# Patient Record
Sex: Female | Born: 1985 | Race: White | Hispanic: No | Marital: Single
Health system: Midwestern US, Community
[De-identification: ages and names within clinical notes are randomized; demographics above are authoritative.]

## PROBLEM LIST (undated history)

## (undated) DIAGNOSIS — R55 Syncope and collapse: Principal | ICD-10-CM

## (undated) DIAGNOSIS — B1920 Unspecified viral hepatitis C without hepatic coma: Secondary | ICD-10-CM

## (undated) HISTORY — PX: OTHER SURGICAL HISTORY: SHX169

---

## 2020-10-12 ENCOUNTER — Encounter (HOSPITAL_COMMUNITY): Payer: Self-pay | Admitting: *Deleted

## 2020-10-12 ENCOUNTER — Other Ambulatory Visit: Payer: Self-pay

## 2020-10-12 ENCOUNTER — Emergency Department (HOSPITAL_COMMUNITY): Payer: Self-pay

## 2020-10-12 ENCOUNTER — Emergency Department (HOSPITAL_COMMUNITY)
Admission: EM | Admit: 2020-10-12 | Discharge: 2020-10-12 | Disposition: A | Discharge status: Home / Self Care | Payer: Self-pay | Attending: Emergency Medicine | Admitting: Emergency Medicine

## 2020-10-12 DIAGNOSIS — S1091XA Abrasion of unspecified part of neck, initial encounter: Secondary | ICD-10-CM | POA: Insufficient documentation

## 2020-10-12 DIAGNOSIS — S0990XA Unspecified injury of head, initial encounter: Secondary | ICD-10-CM | POA: Insufficient documentation

## 2020-10-12 DIAGNOSIS — T148XXA Other injury of unspecified body region, initial encounter: Secondary | ICD-10-CM

## 2020-10-12 DIAGNOSIS — Z91040 Latex allergy status: Secondary | ICD-10-CM | POA: Insufficient documentation

## 2020-10-12 DIAGNOSIS — I959 Hypotension, unspecified: Secondary | ICD-10-CM | POA: Insufficient documentation

## 2020-10-12 HISTORY — DX: Unspecified viral hepatitis C without hepatic coma: B19.20

## 2020-10-12 MED ORDER — LORAZEPAM 2 MG/ML IJ SOLN
2.0000 mg | Freq: Once | INTRAMUSCULAR | Status: AC
  Administered 2020-10-12: 2 mg via INTRAMUSCULAR
  Filled 2020-10-12: qty 1

## 2020-10-12 MED ORDER — LACTATED RINGERS IV BOLUS
2000.0000 mL | Freq: Once | INTRAVENOUS | Status: AC
  Administered 2020-10-12: 2000 mL via INTRAVENOUS

## 2020-10-12 NOTE — ED Notes (Signed)
Much calmer.  
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
## 2022-04-15 ENCOUNTER — Inpatient Hospital Stay
Admit: 2022-04-15 | Discharge: 2022-04-15 | Disposition: A | Discharge status: Home / Self Care | Attending: Emergency Medicine

## 2022-04-15 DIAGNOSIS — F1120 Opioid dependence, uncomplicated: Secondary | ICD-10-CM

## 2022-04-15 MED ORDER — DICYCLOMINE HCL 10 MG PO CAPS
10 MG | Freq: Once | ORAL | Status: AC
Start: 2022-04-15 — End: 2022-04-15
  Administered 2022-04-15: 21:00:00 20 mg via ORAL

## 2022-04-15 MED ORDER — ONDANSETRON HCL 4 MG PO TABS
4 MG | Freq: Once | ORAL | Status: AC
Start: 2022-04-15 — End: 2022-04-15
  Administered 2022-04-15: 21:00:00 4 mg via ORAL

## 2022-04-15 MED FILL — ONDANSETRON HCL 4 MG PO TABS: 4 MG | ORAL | Qty: 1

## 2022-04-15 MED FILL — DICYCLOMINE HCL 10 MG PO CAPS: 10 MG | ORAL | Qty: 2

## 2022-04-15 NOTE — ED Provider Notes (Signed)
[facility] EMERGENCY DEPT  EMERGENCY DEPARTMENT ENCOUNTER      Pt Name: Shelley Nguyen  MRN: [id number]  Birthdate 07/17/1986  Date of evaluation: 04/15/2022  Provider: Charolett Bumpers, MD    CHIEF COMPLAINT       Chief Complaint   Patient presents with    Medication Refill     Pt moved 10 days ago from [facility] unexpectantly. Pt states that she has run out of suboxone. Pt reports she is 111 days clean from heroin and completed treatment may 25. Pt was in [facility]. Pt state's "if yall aren't going to help me, I'm going to relapse" pt states that she was taking two 8mg  buccal strips daily         HISTORY OF PRESENT ILLNESS   (Location/Symptom, Timing/Onset, Context/Setting, Quality, Duration, Modifying Factors, Severity)  Note limiting factors.   Narelle Schoening is a 36 y.o. female who presents to the emergency department concerns of wanting Suboxone refilled.  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  She has been clean for the last 111 days.  Moved here over the last 10 days and is now out of her Suboxone.  She was in [facility] treatment center.  Denies any SI or HI.  No chest pain, shortness of breath or other symptom.    HPI    Nursing Notes were reviewed.    REVIEW OF SYSTEMS    (2-9 systems for level 4, 10 or more for level 5)     Review of Systems   All other systems reviewed and are negative.    Except as noted above the remainder of the review of systems was reviewed and negative.       PAST MEDICAL HISTORY   History reviewed. No pertinent past medical history.      SURGICAL HISTORY     History reviewed. No pertinent surgical history.      CURRENT MEDICATIONS       Previous Medications    No medications on file       ALLERGIES     Latex and Sulfa antibiotics    FAMILY HISTORY     No family history on file.       SOCIAL HISTORY       Social History     Socioeconomic History    Marital status: Single     Spouse name: None    Number of children: None    Years of education: None    Highest education  level: None       SCREENINGS         Glasgow Coma Scale  Eye Opening: Spontaneous  Best Verbal Response: Oriented  Best Motor Response: Obeys commands  Glasgow Coma Scale Score: 15                     CIWA Assessment  BP: 120/78  Pulse: 64                 PHYSICAL EXAM    (up to 7 for level 4, 8 or more for level 5)     ED Triage Vitals [04/15/22 1546]   BP Temp Temp src Pulse Respirations SpO2 Height Weight - Scale   120/78 -- -- 64 18 99 % 5\' 1"  (1.549 m) 150 lb (68 kg)       Physical Exam  Vitals and nursing note reviewed.   Constitutional:       General: She is not in  acute distress.     Appearance: Normal appearance.   HENT:      Head: Normocephalic and atraumatic.      Right Ear: External ear normal.      Left Ear: External ear normal.      Nose: Nose normal.      Mouth/Throat:      Mouth: Mucous membranes are moist.   Eyes:      Extraocular Movements: Extraocular movements intact.      Pupils: Pupils are equal, round, and reactive to light.   Cardiovascular:      Rate and Rhythm: Normal rate and regular rhythm.      Pulses: Normal pulses.      Heart sounds: Normal heart sounds.   Musculoskeletal:         General: Normal range of motion.      Cervical back: Normal range of motion and neck supple. No tenderness.   Skin:     General: Skin is warm and dry.      Capillary Refill: Capillary refill takes less than 2 seconds.   Neurological:      General: No focal deficit present.      Mental Status: She is alert and oriented to person, place, and time. Mental status is at baseline.   Psychiatric:         Mood and Affect: Mood normal.         Behavior: Behavior normal.       DIAGNOSTIC RESULTS     EKG: All EKG's are interpreted by the Emergency Department Physician who either signs or Co-signs this chart in the absence of a cardiologist.        RADIOLOGY:   Non-plain film images such as CT, Ultrasound and MRI are read by the radiologist. Plain radiographic images are visualized and preliminarily interpreted by the  emergency physician with the below findings:        Interpretation per the Radiologist below, if available at the time of this note:    No orders to display         ED BEDSIDE ULTRASOUND:   Performed by ED Physician - none    LABS:  Labs Reviewed - No data to display    All other labs were within normal range or not returned as of this dictation.    EMERGENCY DEPARTMENT COURSE and DIFFERENTIAL DIAGNOSIS/MDM:   Vitals:    Vitals:    04/15/22 1546   BP: 120/78   Pulse: 64   Resp: 18   SpO2: 99%   Weight: 68 kg   Height: 5\' 1"  (1.549 m)           Medical Decision Making  36 year old female presents for concerns of wanting a Suboxone refill.  I have had a lengthy chat with the patient that she would need to follow-up with the [facility].  We will provide a dose of Bentyl and Zofran here.  She does not appear to be actively withdrawing at this moment.    Risk  Prescription drug management.            REASSESSMENT          CRITICAL CARE TIME     FINAL IMPRESSION      1. Uncomplicated opioid dependence [facility])          DISPOSITION/PLAN   DISPOSITION Decision To Discharge 04/15/2022 04:59:03 PM      PATIENT REFERRED TO:  [facility]  [address]  [place]  [place] [address]  [phone]          DISCHARGE MEDICATIONS:  New Prescriptions    No medications on file     Controlled Substances Monitoring:     No flowsheet data found.    (Please note that portions of this note were completed with a voice recognition program.  Efforts were made to edit the dictations but occasionally words are mis-transcribed.)    Charolett Bumpers, MD (electronically signed)  Attending Emergency Physician            Charolett Bumpers, MD  04/15/22 [phone]
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
## 2022-11-24 NOTE — Telephone Encounter (Signed)
Working media referral to [facility] for hand and wrist- he does not treat    Called and vm box was not set up yet

## 2022-11-27 NOTE — Telephone Encounter (Addendum)
Call # 2 vm box was full
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
## 2022-12-19 ENCOUNTER — Encounter: Attending: Orthopaedic Surgery

## 2022-12-19 NOTE — Telephone Encounter (Signed)
error 

## 2023-01-01 ENCOUNTER — Encounter: Admit: 2023-01-01 | Discharge: 2023-01-01 | Attending: Physician Assistant

## 2023-04-02 ENCOUNTER — Emergency Department: Admit: 2023-04-02 | Payer: PRIVATE HEALTH INSURANCE

## 2023-04-02 ENCOUNTER — Inpatient Hospital Stay
Admit: 2023-04-02 | Discharge: 2023-04-02 | Disposition: A | Discharge status: Home / Self Care | Payer: PRIVATE HEALTH INSURANCE | Attending: Emergency Medicine

## 2023-04-02 DIAGNOSIS — M5442 Lumbago with sciatica, left side: Secondary | ICD-10-CM

## 2023-04-02 MED ORDER — METHOCARBAMOL 750 MG PO TABS
750 | ORAL_TABLET | Freq: Four times a day (QID) | ORAL | 0 refills | Status: AC
Start: 2023-04-02 — End: 2023-04-07

## 2023-04-02 MED ORDER — NAPROXEN 500 MG PO TABS
500 | ORAL_TABLET | Freq: Two times a day (BID) | ORAL | 0 refills | Status: AC
Start: 2023-04-02 — End: 2023-04-07

## 2023-04-02 MED ORDER — MUPIROCIN 2 % EX OINT
2 | CUTANEOUS | 0 refills | Status: AC
Start: 2023-04-02 — End: 2023-04-09

## 2023-04-02 MED ORDER — LIDOCAINE HCL 4 % EX CREA
4 | Freq: Four times a day (QID) | CUTANEOUS | 0 refills | Status: AC | PRN
Start: 2023-04-02 — End: ?

## 2023-04-02 MED ORDER — KETOROLAC TROMETHAMINE 15 MG/ML IJ SOLN
15 | Freq: Once | INTRAMUSCULAR | Status: AC
Start: 2023-04-02 — End: 2023-04-02
  Administered 2023-04-02: 23:00:00 15 mg via INTRAMUSCULAR

## 2023-04-02 MED ORDER — LIDOCAINE 4 % EX PTCH
4 | Freq: Once | CUTANEOUS | Status: DC
Start: 2023-04-02 — End: 2023-04-02
  Administered 2023-04-02: 23:00:00 1 via TRANSDERMAL

## 2023-04-02 MED FILL — SALONPAS PAIN RELIEVING 4 % EX PTCH: 4 % | CUTANEOUS | Qty: 1

## 2023-04-02 MED FILL — KETOROLAC TROMETHAMINE 15 MG/ML IJ SOLN: 15 MG/ML | INTRAMUSCULAR | Qty: 1
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
## 2023-04-02 NOTE — ED Notes (Signed)
Report given and care transferred to Jennifer, RN

## 2023-07-18 ENCOUNTER — Inpatient Hospital Stay
Admit: 2023-07-18 | Discharge: 2023-07-18 | Disposition: A | Discharge status: Home / Self Care | Payer: MEDICAID | Attending: Emergency Medicine

## 2023-07-18 DIAGNOSIS — R0602 Shortness of breath: Secondary | ICD-10-CM

## 2023-07-18 DIAGNOSIS — R0600 Dyspnea, unspecified: Secondary | ICD-10-CM

## 2023-07-18 LAB — COVID-19 & INFLUENZA COMBO (LIAT HOSPITAL)
Influenza A: NOT DETECTED
Influenza B: NOT DETECTED
SARS-CoV-2: NOT DETECTED

## 2023-07-18 MED ORDER — LORAZEPAM 1 MG PO TABS
1 | Freq: Once | ORAL | Status: AC
Start: 2023-07-18 — End: 2023-07-18
  Administered 2023-07-18: 19:00:00 1 mg via ORAL

## 2023-07-18 MED ORDER — LOPERAMIDE HCL 2 MG PO CAPS
2 | Freq: Once | ORAL | Status: AC
Start: 2023-07-18 — End: 2023-07-18
  Administered 2023-07-18: 18:00:00 2 mg via ORAL

## 2023-07-18 MED FILL — LOPERAMIDE HCL 2 MG PO CAPS: 2 MG | ORAL | Qty: 1 | Fill #0

## 2023-07-18 MED FILL — LORAZEPAM 1 MG PO TABS: 1 MG | ORAL | Qty: 1 | Fill #0

## 2023-07-18 NOTE — ED Provider Notes (Signed)
 [facility]  EMERGENCY DEPARTMENT ENCOUNTER      Pt Name: Shelley Nguyen  MRN: [id number]  Birthdate 06/25/1986  Date of evaluation: 07/18/2023  Provider: Charolett Bumpers, MD  1:48 PM    CHIEF COMPLAINT       Chief Complaint   Patient presents

## 2023-07-20 LAB — EKG 12-LEAD
P Axis: 69 degrees
P-R Interval: 114 ms
Q-T Interval: 346 ms
QRS Duration: 70 ms
QTc Calculation (Bazett): 393 ms
R Axis: 64 degrees
T Axis: 50 degrees
Ventricular Rate: 90 {beats}/min

## 2023-07-22 ENCOUNTER — Inpatient Hospital Stay
Admit: 2023-07-22 | Discharge: 2023-07-23 | Disposition: A | Discharge status: Home / Self Care | Payer: MEDICAID | Admitting: Emergency Medicine

## 2023-07-22 ENCOUNTER — Emergency Department: Admit: 2023-07-23 | Payer: MEDICAID

## 2023-07-22 DIAGNOSIS — J40 Bronchitis, not specified as acute or chronic: Secondary | ICD-10-CM

## 2023-07-22 MED ORDER — LORAZEPAM 1 MG PO TABS
1 | Freq: Once | ORAL | Status: AC
Start: 2023-07-22 — End: 2023-07-22

## 2023-07-22 MED ORDER — LORAZEPAM 1 MG PO TABS
1 | ORAL | Status: AC
Start: 2023-07-22 — End: 2023-07-22
  Administered 2023-07-23: 1 via ORAL

## 2023-07-22 MED ORDER — LORAZEPAM 2 MG/ML IJ SOLN
2 | Freq: Once | INTRAMUSCULAR | Status: DC
Start: 2023-07-22 — End: 2023-07-22

## 2023-07-22 MED FILL — LORAZEPAM 1 MG PO TABS: 1 MG | ORAL | Qty: 1

## 2023-07-22 NOTE — Discharge Instructions (Addendum)
 Please return to the emergency room with any acute questions, concerns, worsening status or any delay to your outpatient care.    Thank you so much for visiting with [place] today.  You have had a screening emergency medical exam and to this point, no emergent

## 2023-07-22 NOTE — ED Provider Notes (Signed)
 [facility]  EMERGENCY DEPARTMENT ENCOUNTER      Pt Name: Shelley Nguyen  MRN: [id number]  Birthdate May 08, 1986  Date of evaluation: 07/22/2023  Provider: Brendolyn Patty, MD  Provider evaluation time: 07/22/23 1809    CHIEF COMPLAINT       Chi

## 2023-07-23 LAB — CBC WITH AUTO DIFFERENTIAL
Basophils %: 0.3 % (ref 0.0–2.0)
Basophils Absolute: 0 10*3/uL (ref 0.0–0.2)
Eosinophils %: 3.7 % (ref 0.0–7.0)
Eosinophils Absolute: 0.4 10*3/uL (ref 0.0–0.5)
Hematocrit: 39.2 % (ref 34.0–47.0)
Hemoglobin: 12.7 g/dL (ref 11.5–15.7)
Immature Grans (Abs): 0.01 10*3/uL (ref 0.00–0.06)
Immature Granulocytes %: 0.1 % (ref 0.0–0.6)
Lymphocytes Absolute: 3.5 10*3/uL — ABNORMAL HIGH (ref 1.0–3.2)
Lymphocytes: 30.6 % (ref 15.0–45.0)
MCH: 28 pg (ref 27.0–34.5)
MCHC: 32.4 g/dL (ref 30.0–36.0)
MCV: 86.3 fL (ref 81.0–99.0)
MPV: 10.3 fL (ref 7.0–12.2)
Monocytes %: 7.8 % (ref 4.0–12.0)
Monocytes Absolute: 0.9 10*3/uL (ref 0.3–1.0)
Neutrophils %: 57.5 % (ref 42.0–74.0)
Neutrophils Absolute: 6.6 10*3/uL (ref 1.6–7.3)
Platelets: 364 10*3/uL (ref 140–440)
RBC: 4.54 x10e6/mcL (ref 3.60–5.20)
RDW: 13 % (ref 10.0–17.0)
WBC: 11.6 10*3/uL — ABNORMAL HIGH (ref 3.8–10.6)

## 2023-07-23 LAB — BASIC METABOLIC PANEL W/ REFLEX TO MG FOR LOW K
Anion Gap: 11 mmol/L (ref 2–17)
BUN: 5 mg/dL — ABNORMAL LOW (ref 6–20)
CO2: 26 mmol/L (ref 22–29)
Calcium: 9 mg/dL (ref 8.5–10.7)
Chloride: 101 mmol/L (ref 98–107)
Creatinine: 0.5 mg/dL (ref 0.5–1.0)
Est, Glom Filt Rate: 125 mL/min/1.73mÂ² (ref >=60)
Glucose: 101 mg/dL — ABNORMAL HIGH (ref 70–99)
Osmolaliy Calculated: 273 mosm/kg (ref 270–287)
Potassium: 3.2 mmol/L — ABNORMAL LOW (ref 3.5–5.3)
Sodium: 138 mmol/L (ref 135–145)

## 2023-07-23 LAB — URINE DRUG SCREEN
Amphetamine Screen, Ur: NEGATIVE
Barbiturate Screen, Ur: NEGATIVE
Benzodiazepine Screen, Urine: POSITIVE — AB
Cannabinoid Scrn, Ur: NEGATIVE
Cocaine Screen Urine: POSITIVE — AB
Fentanyl, Ur: NEGATIVE
Opiate Screen, Urine: NEGATIVE

## 2023-07-23 LAB — D-DIMER, QUANTITATIVE: D-Dimer, Quant: <=0.27 CD:295178345 (ref 0.19–0.50)

## 2023-07-23 LAB — MAGNESIUM: Magnesium: 2 mg/dL (ref 1.6–2.6)

## 2023-07-23 LAB — TROPONIN: Troponin, High Sensitivity: <6 ng/L (ref 0–14)

## 2023-07-23 MED ORDER — PREDNISONE 20 MG PO TABS
20 | ORAL_TABLET | Freq: Every day | ORAL | 0 refills | Status: AC
Start: 2023-07-23 — End: 2023-07-28

## 2023-07-23 MED ORDER — AZITHROMYCIN 250 MG PO TABS
250 | PACK | ORAL | 0 refills | Status: DC
Start: 2023-07-23 — End: 2023-07-22

## 2023-07-23 MED ORDER — ALBUTEROL SULFATE HFA 108 (90 BASE) MCG/ACT IN AERS
108 | Freq: Four times a day (QID) | RESPIRATORY_TRACT | 0 refills | Status: DC | PRN
Start: 2023-07-23 — End: 2023-07-22

## 2023-07-23 MED ORDER — AZITHROMYCIN 250 MG PO TABS
250 | PACK | ORAL | 0 refills | Status: AC
Start: 2023-07-23 — End: ?

## 2023-07-23 MED ORDER — PREDNISONE 20 MG PO TABS
20 | ORAL_TABLET | Freq: Every day | ORAL | 0 refills | Status: DC
Start: 2023-07-23 — End: 2023-07-22

## 2023-07-23 MED ORDER — ALBUTEROL SULFATE HFA 108 (90 BASE) MCG/ACT IN AERS
108 | Freq: Four times a day (QID) | RESPIRATORY_TRACT | 0 refills | Status: AC | PRN
Start: 2023-07-23 — End: ?

## 2023-07-24 LAB — EKG 12-LEAD
P Axis: 65 degrees
P-R Interval: 122 ms
Q-T Interval: 342 ms
QRS Duration: 74 ms
QTc Calculation (Bazett): 401 ms
R Axis: 62 degrees
T Axis: 31 degrees
Ventricular Rate: 103 {beats}/min

## 2023-12-09 NOTE — Progress Notes (Signed)
 This encounter was created in error - please disregard.

## 2024-02-20 DIAGNOSIS — R1084 Generalized abdominal pain: Principal | ICD-10-CM
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
## 2024-02-21 ENCOUNTER — Inpatient Hospital Stay
Admit: 2024-02-21 | Discharge: 2024-02-21 | Discharge status: Against Medical Advice | Payer: PRIVATE HEALTH INSURANCE | Attending: Emergency Medicine

## 2024-09-04 ENCOUNTER — Emergency Department: Admit: 2024-09-04

## 2024-09-04 ENCOUNTER — Inpatient Hospital Stay
Admit: 2024-09-04 | Discharge: 2024-09-04 | Disposition: A | Discharge status: Home / Self Care | Arrived: AM | Attending: Emergency Medicine

## 2024-09-04 DIAGNOSIS — R55 Syncope and collapse: Principal | ICD-10-CM

## 2024-09-04 LAB — COMPREHENSIVE METABOLIC PANEL
ALT: 35 U/L (ref 0–42)
AST: 41 U/L (ref 0–46)
Albumin/Globulin Ratio: 1.65 (ref 1.00–2.70)
Albumin: 3.8 g/dL (ref 3.5–5.2)
Alk Phosphatase: 69 U/L (ref 35–117)
Anion Gap: 10 mmol/L (ref 2–17)
BUN: 9 mg/dL (ref 6–20)
CALCIUM,CORRECTED,CCA: 8.8 mg/dL (ref 8.5–10.7)
CO2: 22 mmol/L (ref 22–29)
Calcium: 8.6 mg/dL (ref 8.5–10.7)
Chloride: 104 mmol/L (ref 98–107)
Creatinine: 0.6 mg/dL (ref 0.5–1.0)
Est, Glom Filt Rate: 118 mL/min/1.73m?? (ref >=60)
Globulin: 2.3 g/dL (ref 1.9–4.4)
Glucose: 87 mg/dL (ref 70–99)
Osmolaliy Calculated: 271 mosm/kg (ref 270–287)
Potassium: 3.6 mmol/L (ref 3.5–5.3)
Sodium: 137 mmol/L (ref 135–145)
Total Bilirubin: 0.36 mg/dL (ref 0.00–1.20)
Total Protein: 6.1 g/dL (ref 5.7–8.3)

## 2024-09-04 LAB — CBC WITH AUTO DIFFERENTIAL
Basophils %: 0.2 % (ref 0.0–2.0)
Basophils Absolute: 0 x10e3/mcL (ref 0.0–0.2)
Eosinophils %: 2.9 % (ref 0.0–7.0)
Eosinophils Absolute: 0.3 x10e3/mcL (ref 0.0–0.5)
Hematocrit: 34.2 % (ref 34.0–47.0)
Hemoglobin: 11.6 g/dL (ref 11.5–15.7)
Immature Grans (Abs): 0.07 x10e3/mcL — ABNORMAL HIGH (ref 0.00–0.06)
Immature Granulocytes %: 0.7 % — ABNORMAL HIGH (ref 0.0–0.6)
Lymphocytes Absolute: 2.7 x10e3/mcL (ref 1.0–3.2)
Lymphocytes: 27.3 % (ref 15.0–45.0)
MCH: 29.4 pg (ref 27.0–34.5)
MCHC: 33.9 g/dL (ref 30.0–36.0)
MCV: 86.6 fL (ref 81.0–99.0)
MPV: 10.1 fL (ref 7.0–12.2)
Monocytes %: 12.3 % — ABNORMAL HIGH (ref 4.0–12.0)
Monocytes Absolute: 1.2 x10e3/mcL — ABNORMAL HIGH (ref 0.3–1.0)
Neutrophils %: 56.6 % (ref 42.0–74.0)
Neutrophils Absolute: 5.6 x10e3/mcL (ref 1.6–7.3)
Platelets: 244 x10e3/mcL (ref 140–440)
RBC: 3.95 x10e6/mcL (ref 3.60–5.20)
RDW: 13.1 % (ref 10.0–17.0)
WBC: 9.9 x10e3/mcL (ref 3.8–10.6)

## 2024-09-04 LAB — PROTIME-INR
INR: 1.1 — ABNORMAL LOW (ref 1.5–3.5)
Protime: 14.5 s (ref 11.6–14.5)

## 2024-09-04 LAB — EKG 12-LEAD
P Axis: 78 degrees
P-R Interval: 120 ms
Q-T Interval: 314 ms
QRS Duration: 70 ms
QTc Calculation (Bazett): 374 ms
R Axis: 78 degrees
T Axis: 53 degrees
Ventricular Rate: 103 {beats}/min

## 2024-09-04 LAB — URINALYSIS W/ RFLX MICROSCOPIC
Bilirubin, Urine: NEGATIVE
Blood, Urine: NEGATIVE
Glucose, Ur: NEGATIVE mg/dL
Ketones, Urine: NEGATIVE mg/dL
Leukocyte Esterase, Urine: NEGATIVE
Nitrite, Urine: NEGATIVE
Specific Gravity, UA: 1.025 (ref 1.003–1.035)
Urobilinogen, Urine: 1 EU/dL (ref 0.2–1.0)
pH, Urine: 5 (ref 4.5–8.0)

## 2024-09-04 LAB — MICROSCOPIC URINALYSIS
MUCUS, URINE: NONE SEEN /LPF
RBC, UA: NONE SEEN /HPF (ref 0–2)

## 2024-09-04 LAB — URINE DRUG SCREEN
Amphetamine Screen, Ur: NEGATIVE
Barbiturate Screen, Ur: NEGATIVE
Benzodiazepine Screen, Urine: POSITIVE — AB
Cannabinoid Scrn, Ur: NEGATIVE
Cocaine Screen Urine: POSITIVE — AB
Fentanyl, Ur: NEGATIVE
Opiate Screen, Urine: NEGATIVE

## 2024-09-04 LAB — ETHANOL: Ethanol Lvl: <10.1 mg/dL (ref 0.0–10.1)

## 2024-09-04 LAB — ADD ON LAB TEST

## 2024-09-04 LAB — D-DIMER, QUANTITATIVE: D-Dimer, Quant: 0.35 CD:295178345 (ref 0.19–0.50)

## 2024-09-04 MED ORDER — SODIUM CHLORIDE 0.9 % IV BOLUS
0.9 | Freq: Once | INTRAVENOUS | Status: AC
Start: 2024-09-04 — End: 2024-09-04
  Administered 2024-09-04: 17:00:00 1000 mL via INTRAVENOUS

## 2024-09-04 MED ORDER — KETOROLAC TROMETHAMINE 15 MG/ML IJ SOLN
15 | Freq: Once | INTRAMUSCULAR | Status: AC
Start: 2024-09-04 — End: 2024-09-04
  Administered 2024-09-04: 17:00:00 15 mg via INTRAVENOUS

## 2024-09-04 MED ORDER — ONDANSETRON 4 MG PO TBDP
4 | ORAL | Status: AC
Start: 2024-09-04 — End: 2024-09-04
  Administered 2024-09-04: 17:00:00 4 via ORAL

## 2024-09-04 MED ORDER — ONDANSETRON HCL 4 MG/2ML IJ SOLN
4 | Freq: Once | INTRAMUSCULAR | Status: AC
Start: 2024-09-04 — End: 2024-09-04
  Administered 2024-09-04: 17:00:00 4 mg via INTRAVENOUS

## 2024-09-04 MED ORDER — ONDANSETRON 4 MG PO TBDP
4 | Freq: Three times a day (TID) | ORAL | Status: DC | PRN
Start: 2024-09-04 — End: 2024-09-04

## 2024-09-04 MED FILL — ONDANSETRON HCL 4 MG/2ML IJ SOLN: 4 MG/2ML | INTRAMUSCULAR | Qty: 2

## 2024-09-04 MED FILL — KETOROLAC TROMETHAMINE 15 MG/ML IJ SOLN: 15 mg/mL | INTRAMUSCULAR | Qty: 1

## 2024-09-04 MED FILL — ONDANSETRON 4 MG PO TBDP: 4 mg | ORAL | Qty: 1

## 2024-09-04 NOTE — Discharge Instructions (Addendum)
 Drink plenty of fluids, avoid caffeine, alcohol, and other drugs. Return for recurrence of passing out, chest pain, trouble breathing, fever, lethargy, confusion, or other concerns.  Continue your Xanax until you are seen by addiction medicine and then wean it down under their supervision
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
## 2024-09-05 LAB — HEPATITIS PANEL, ACUTE
Hep A IgM: NEGATIVE
Hep B Core Ab, IgM: NEGATIVE
Hepatitis B Surface Ag: NEGATIVE
Hepatitis C Ab: POSITIVE

## 2024-09-05 NOTE — Telephone Encounter (Signed)
-----   Message from Dr. Jayson Ferry, MD sent at 09/05/2024  8:52 AM EST -----  Regarding: patient from ER  I received note from patient that had been to the emergency department yesterday.  She had syncope.  There was issues regarding benzodiazepine withdrawal as well.  And she has chronic mental health issues.  Please reach out to patient to see if she is interested in a follow-up appointment.  If she is on long-term benzodiazepines let her know that we do not prescribe benzodiazepines and if she is interested in detox would give her the contact information for [facility].  Another option would be an appointment with the psychiatry department since she does have some chronic mental health issues and substance dependence.  ----- Message -----  From: Debora Elspeth Redbird, MD  Sent: 09/05/2024   1:52 AM EST  To: Jayson MARLA Ferry, MD

## 2024-09-05 NOTE — Telephone Encounter (Signed)
"  Unable to leave vmail   "

## 2024-09-08 NOTE — Telephone Encounter (Signed)
"  Unable to leave vmail   "
